# Patient Record
Sex: Female | Born: 1995 | Race: Black or African American | Hispanic: No | Marital: Single | Smoking: Current every day smoker
Health system: Southern US, Community
[De-identification: ages and names within clinical notes are randomized; demographics above are authoritative.]

---
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
## 2019-01-03 ENCOUNTER — Other Ambulatory Visit: Payer: Self-pay

## 2019-01-03 ENCOUNTER — Emergency Department (HOSPITAL_COMMUNITY)
Admission: EM | Admit: 2019-01-03 | Discharge: 2019-01-03 | Disposition: A | Discharge status: Home / Self Care | Payer: Self-pay | Attending: Emergency Medicine | Admitting: Emergency Medicine

## 2019-01-03 ENCOUNTER — Encounter (HOSPITAL_COMMUNITY): Payer: Self-pay | Admitting: Emergency Medicine

## 2019-01-03 DIAGNOSIS — Y939 Activity, unspecified: Secondary | ICD-10-CM | POA: Insufficient documentation

## 2019-01-03 DIAGNOSIS — M542 Cervicalgia: Secondary | ICD-10-CM | POA: Insufficient documentation

## 2019-01-03 DIAGNOSIS — F17210 Nicotine dependence, cigarettes, uncomplicated: Secondary | ICD-10-CM | POA: Insufficient documentation

## 2019-01-03 DIAGNOSIS — S39012A Strain of muscle, fascia and tendon of lower back, initial encounter: Secondary | ICD-10-CM | POA: Insufficient documentation

## 2019-01-03 DIAGNOSIS — Y999 Unspecified external cause status: Secondary | ICD-10-CM | POA: Insufficient documentation

## 2019-01-03 DIAGNOSIS — Y929 Unspecified place or not applicable: Secondary | ICD-10-CM | POA: Insufficient documentation

## 2019-01-03 MED ORDER — CYCLOBENZAPRINE HCL 5 MG PO TABS: 5.0000 mg | ORAL_TABLET | Freq: Two times a day (BID) | ORAL | 0 refills | Status: AC | PRN
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
## 2021-03-26 DIAGNOSIS — F411 Generalized anxiety disorder: Secondary | ICD-10-CM | POA: Diagnosis not present

## 2021-03-26 DIAGNOSIS — F3181 Bipolar II disorder: Secondary | ICD-10-CM | POA: Diagnosis not present

## 2021-05-01 DIAGNOSIS — F3181 Bipolar II disorder: Secondary | ICD-10-CM | POA: Diagnosis not present

## 2021-05-01 DIAGNOSIS — F411 Generalized anxiety disorder: Secondary | ICD-10-CM | POA: Diagnosis not present

## 2021-05-05 ENCOUNTER — Ambulatory Visit: Payer: No Typology Code available for payment source | Attending: Internal Medicine

## 2021-05-05 ENCOUNTER — Ambulatory Visit: Payer: Self-pay

## 2021-05-05 ENCOUNTER — Other Ambulatory Visit: Payer: Self-pay

## 2021-05-07 ENCOUNTER — Ambulatory Visit: Payer: Self-pay

## 2021-05-09 ENCOUNTER — Ambulatory Visit: Payer: No Typology Code available for payment source | Attending: Internal Medicine

## 2021-05-09 ENCOUNTER — Other Ambulatory Visit: Payer: Self-pay

## 2021-05-09 DIAGNOSIS — Z23 Encounter for immunization: Secondary | ICD-10-CM

## 2021-05-09 NOTE — Progress Notes (Signed)
   Covid-19 Vaccination Clinic  Name:  Barbara Medina    MRN: [id number] DOB: [birth year]  05/09/2021  Barbara Medina was observed post Covid-19 immunization for 15 minutes without incident. She was provided with Vaccine Information Sheet and instruction to access the V-Safe system.   Barbara Medina was instructed to call 911 with any severe reactions post vaccine: Difficulty breathing  Swelling of face and throat  A fast heartbeat  A bad rash all over body  Dizziness and weakness   Immunizations Administered     Name Date Dose VIS Date Route   PFIZER Comrnaty(Gray TOP) Covid-19 Vaccine 05/09/2021  1:42 PM 0.3 mL 10/31/2020 Intramuscular   Manufacturer: [employer], [employer]   Lot: [id number]   NDC: [phone]

## 2021-06-02 ENCOUNTER — Ambulatory Visit: Payer: Self-pay

## 2021-10-28 ENCOUNTER — Other Ambulatory Visit: Payer: Self-pay

## 2021-10-28 ENCOUNTER — Emergency Department (HOSPITAL_BASED_OUTPATIENT_CLINIC_OR_DEPARTMENT_OTHER)
Admission: EM | Admit: 2021-10-28 | Discharge: 2021-10-28 | Disposition: A | Discharge status: Home / Self Care | Payer: 59 | Attending: Emergency Medicine | Admitting: Emergency Medicine

## 2021-10-28 ENCOUNTER — Encounter (HOSPITAL_BASED_OUTPATIENT_CLINIC_OR_DEPARTMENT_OTHER): Payer: Self-pay | Admitting: Emergency Medicine

## 2021-10-28 DIAGNOSIS — J329 Chronic sinusitis, unspecified: Secondary | ICD-10-CM | POA: Diagnosis not present

## 2021-10-28 DIAGNOSIS — Z5321 Procedure and treatment not carried out due to patient leaving prior to being seen by health care provider: Secondary | ICD-10-CM | POA: Insufficient documentation

## 2021-10-28 DIAGNOSIS — R059 Cough, unspecified: Secondary | ICD-10-CM | POA: Diagnosis present

## 2021-10-28 DIAGNOSIS — Z20822 Contact with and (suspected) exposure to covid-19: Secondary | ICD-10-CM | POA: Insufficient documentation

## 2021-10-28 LAB — RESP PANEL BY RT-PCR (FLU A&B, COVID) ARPGX2
Influenza A by PCR: NEGATIVE
Influenza B by PCR: NEGATIVE
SARS Coronavirus 2 by RT PCR: NEGATIVE

## 2021-10-28 NOTE — ED Notes (Signed)
Registration has verified pt left facility a while ago.

## 2021-10-28 NOTE — ED Notes (Signed)
Pt was called for room, no answer.

## 2021-10-28 NOTE — ED Notes (Signed)
Called for Pt in Both Waiting Areas. No answer. x3

## 2021-10-28 NOTE — ED Triage Notes (Signed)
Cough , sinus pain x 1 1/2 weeks  has been taking OTC nyquil but nothing else

## 2022-01-13 ENCOUNTER — Encounter: Payer: Self-pay | Admitting: Family

## 2022-02-11 ENCOUNTER — Emergency Department (HOSPITAL_BASED_OUTPATIENT_CLINIC_OR_DEPARTMENT_OTHER)
Admission: EM | Admit: 2022-02-11 | Discharge: 2022-02-11 | Disposition: A | Discharge status: Home / Self Care | Payer: 59 | Attending: Emergency Medicine | Admitting: Emergency Medicine

## 2022-02-11 ENCOUNTER — Other Ambulatory Visit: Payer: Self-pay

## 2022-02-11 DIAGNOSIS — R059 Cough, unspecified: Secondary | ICD-10-CM | POA: Diagnosis not present

## 2022-02-11 DIAGNOSIS — M791 Myalgia, unspecified site: Secondary | ICD-10-CM | POA: Insufficient documentation

## 2022-02-11 DIAGNOSIS — R0981 Nasal congestion: Secondary | ICD-10-CM | POA: Insufficient documentation

## 2022-02-11 DIAGNOSIS — R1110 Vomiting, unspecified: Secondary | ICD-10-CM | POA: Diagnosis not present

## 2022-02-11 DIAGNOSIS — R067 Sneezing: Secondary | ICD-10-CM | POA: Insufficient documentation

## 2022-02-11 DIAGNOSIS — Z20822 Contact with and (suspected) exposure to covid-19: Secondary | ICD-10-CM | POA: Diagnosis not present

## 2022-02-11 DIAGNOSIS — Z5321 Procedure and treatment not carried out due to patient leaving prior to being seen by health care provider: Secondary | ICD-10-CM | POA: Diagnosis not present

## 2022-02-11 LAB — RESP PANEL BY RT-PCR (FLU A&B, COVID) ARPGX2
Influenza A by PCR: NEGATIVE
Influenza B by PCR: NEGATIVE
SARS Coronavirus 2 by RT PCR: NEGATIVE

## 2022-02-11 NOTE — ED Triage Notes (Signed)
Pt. States she has been coughing, sneezing, vomiting. Pt. States S/S started Monday. C/o body aches and congestion.  ?

## 2022-02-11 NOTE — ED Notes (Signed)
Patient not in room for MD ?

## 2022-02-11 NOTE — ED Notes (Signed)
Looked and called for patient in lobby.  Unable to locate patient ?

## 2022-07-08 ENCOUNTER — Other Ambulatory Visit (HOSPITAL_COMMUNITY): Payer: Self-pay

## 2022-07-08 MED ORDER — CLINDAMYCIN PHOSPHATE 1 % EX LOTN
1.0000 "application " | TOPICAL_LOTION | Freq: Every day | CUTANEOUS | 2 refills | Status: AC
  Filled 2022-07-08: qty 60, 30d supply, fill #0

## 2022-07-14 ENCOUNTER — Other Ambulatory Visit (HOSPITAL_COMMUNITY): Payer: Self-pay

## 2022-07-14 MED ORDER — TRETINOIN 0.01 % EX GEL
Freq: Every evening | CUTANEOUS | 2 refills | Status: AC
Start: 2022-07-14 — End: ?
  Filled 2022-07-14: qty 15, 30d supply, fill #0
  Filled 2022-10-29 – 2022-10-30 (×2): qty 15, 30d supply, fill #1
  Filled 2022-12-22: qty 15, 30d supply, fill #2

## 2022-07-14 MED ORDER — BP WASH 5 % EX LIQD
CUTANEOUS | 2 refills | Status: AC
Start: 2022-07-14 — End: ?
  Filled 2022-07-14: qty 227, 30d supply, fill #0
  Filled 2022-12-22 – 2022-12-24 (×2): qty 227, 30d supply, fill #1

## 2022-07-15 ENCOUNTER — Other Ambulatory Visit (HOSPITAL_COMMUNITY): Payer: Self-pay

## 2022-07-20 ENCOUNTER — Other Ambulatory Visit (HOSPITAL_COMMUNITY): Payer: Self-pay

## 2022-07-21 ENCOUNTER — Other Ambulatory Visit (HOSPITAL_COMMUNITY): Payer: Self-pay

## 2022-08-13 DIAGNOSIS — R051 Acute cough: Secondary | ICD-10-CM | POA: Diagnosis not present

## 2022-08-13 DIAGNOSIS — J3489 Other specified disorders of nose and nasal sinuses: Secondary | ICD-10-CM | POA: Diagnosis not present

## 2022-08-13 DIAGNOSIS — U071 COVID-19: Secondary | ICD-10-CM | POA: Diagnosis not present

## 2022-08-13 DIAGNOSIS — R0981 Nasal congestion: Secondary | ICD-10-CM | POA: Diagnosis not present

## 2022-08-13 DIAGNOSIS — Z6821 Body mass index (BMI) 21.0-21.9, adult: Secondary | ICD-10-CM | POA: Diagnosis not present

## 2022-10-29 ENCOUNTER — Other Ambulatory Visit (HOSPITAL_BASED_OUTPATIENT_CLINIC_OR_DEPARTMENT_OTHER): Payer: Self-pay

## 2022-10-30 ENCOUNTER — Other Ambulatory Visit (HOSPITAL_BASED_OUTPATIENT_CLINIC_OR_DEPARTMENT_OTHER): Payer: Self-pay

## 2022-11-02 ENCOUNTER — Other Ambulatory Visit (HOSPITAL_COMMUNITY): Payer: Self-pay

## 2022-11-02 ENCOUNTER — Other Ambulatory Visit (HOSPITAL_BASED_OUTPATIENT_CLINIC_OR_DEPARTMENT_OTHER): Payer: Self-pay

## 2022-12-22 ENCOUNTER — Other Ambulatory Visit (HOSPITAL_BASED_OUTPATIENT_CLINIC_OR_DEPARTMENT_OTHER): Payer: Self-pay

## 2022-12-22 ENCOUNTER — Encounter (HOSPITAL_BASED_OUTPATIENT_CLINIC_OR_DEPARTMENT_OTHER): Payer: Self-pay | Admitting: Pharmacist

## 2022-12-23 ENCOUNTER — Other Ambulatory Visit: Payer: Self-pay

## 2022-12-23 ENCOUNTER — Other Ambulatory Visit (HOSPITAL_BASED_OUTPATIENT_CLINIC_OR_DEPARTMENT_OTHER): Payer: Self-pay

## 2022-12-24 ENCOUNTER — Other Ambulatory Visit (HOSPITAL_BASED_OUTPATIENT_CLINIC_OR_DEPARTMENT_OTHER): Payer: Self-pay

## 2022-12-24 ENCOUNTER — Other Ambulatory Visit: Payer: Self-pay

## 2022-12-25 ENCOUNTER — Other Ambulatory Visit (HOSPITAL_BASED_OUTPATIENT_CLINIC_OR_DEPARTMENT_OTHER): Payer: Self-pay

## 2023-02-12 ENCOUNTER — Other Ambulatory Visit (HOSPITAL_COMMUNITY): Payer: Self-pay

## 2023-02-12 MED ORDER — TRETINOIN 0.1 % EX CREA
TOPICAL_CREAM | CUTANEOUS | 2 refills | Status: AC
  Filled 2023-02-12: qty 45, 30d supply, fill #0
  Filled 2023-11-02: qty 45, 30d supply, fill #1

## 2023-02-12 MED ORDER — BENZOYL PEROXIDE 5 % EX GEL
CUTANEOUS | 2 refills | Status: AC
  Filled 2023-02-12: qty 60, 20d supply, fill #0
  Filled 2023-11-02: qty 90, 30d supply, fill #1

## 2023-02-12 MED ORDER — HYDROQUINONE 4 % EX CREA
TOPICAL_CREAM | CUTANEOUS | 2 refills | Status: AC
  Filled 2023-02-12: qty 28.35, 30d supply, fill #0
  Filled 2023-11-02: qty 28.35, 30d supply, fill #1

## 2023-02-12 MED ORDER — CLINDAMYCIN PHOSPHATE 1 % EX LOTN
TOPICAL_LOTION | CUTANEOUS | 2 refills | Status: AC
  Filled 2023-02-12: qty 60, 30d supply, fill #0
  Filled 2023-11-02: qty 60, 30d supply, fill #1

## 2023-02-13 ENCOUNTER — Other Ambulatory Visit (HOSPITAL_COMMUNITY): Payer: Self-pay

## 2023-02-15 ENCOUNTER — Other Ambulatory Visit (HOSPITAL_COMMUNITY): Payer: Self-pay

## 2023-02-16 ENCOUNTER — Other Ambulatory Visit (HOSPITAL_COMMUNITY): Payer: Self-pay

## 2023-11-02 ENCOUNTER — Other Ambulatory Visit (HOSPITAL_COMMUNITY): Payer: Self-pay

## 2023-11-08 ENCOUNTER — Other Ambulatory Visit (HOSPITAL_COMMUNITY): Payer: Self-pay

## 2023-11-09 ENCOUNTER — Other Ambulatory Visit (HOSPITAL_BASED_OUTPATIENT_CLINIC_OR_DEPARTMENT_OTHER): Payer: Self-pay

## 2023-11-09 ENCOUNTER — Other Ambulatory Visit (HOSPITAL_COMMUNITY): Payer: Self-pay
